# Patient Record
Sex: Male | Born: 1965 | Race: White | Hispanic: No | Marital: Married | Smoking: Current every day smoker
Health system: Southern US, Community
[De-identification: ages and names within clinical notes are randomized; demographics above are authoritative.]

## PROBLEM LIST (undated history)

## (undated) DIAGNOSIS — C61 Malignant neoplasm of prostate: Secondary | ICD-10-CM

---

## 2009-06-01 ENCOUNTER — Ambulatory Visit: Payer: Self-pay | Admitting: Urology

## 2017-08-10 ENCOUNTER — Other Ambulatory Visit: Payer: Self-pay

## 2017-08-10 ENCOUNTER — Emergency Department
Admission: EM | Admit: 2017-08-10 | Discharge: 2017-08-10 | Disposition: A | Discharge status: Home / Self Care | Payer: BLUE CROSS/BLUE SHIELD | Attending: Emergency Medicine | Admitting: Emergency Medicine

## 2017-08-10 ENCOUNTER — Emergency Department: Payer: BLUE CROSS/BLUE SHIELD

## 2017-08-10 ENCOUNTER — Encounter: Payer: Self-pay | Admitting: Emergency Medicine

## 2017-08-10 DIAGNOSIS — R030 Elevated blood-pressure reading, without diagnosis of hypertension: Secondary | ICD-10-CM | POA: Diagnosis not present

## 2017-08-10 DIAGNOSIS — Z8546 Personal history of malignant neoplasm of prostate: Secondary | ICD-10-CM | POA: Diagnosis not present

## 2017-08-10 DIAGNOSIS — R221 Localized swelling, mass and lump, neck: Secondary | ICD-10-CM | POA: Diagnosis present

## 2017-08-10 DIAGNOSIS — F17210 Nicotine dependence, cigarettes, uncomplicated: Secondary | ICD-10-CM | POA: Insufficient documentation

## 2017-08-10 DIAGNOSIS — T148XXA Other injury of unspecified body region, initial encounter: Secondary | ICD-10-CM

## 2017-08-10 DIAGNOSIS — R609 Edema, unspecified: Secondary | ICD-10-CM

## 2017-08-10 HISTORY — DX: Malignant neoplasm of prostate: C61

## 2017-08-10 MED ORDER — IBUPROFEN 600 MG PO TABS: 600.0000 mg | ORAL_TABLET | Freq: Three times a day (TID) | ORAL | 0 refills | Status: AC | PRN

## 2017-08-10 MED ORDER — CLONIDINE HCL 0.1 MG PO TABS
0.2000 mg | ORAL_TABLET | Freq: Once | ORAL | Status: AC
  Administered 2017-08-10: 0.2 mg via ORAL
  Filled 2017-08-10: qty 2

## 2017-08-10 MED ORDER — ORPHENADRINE CITRATE ER 100 MG PO TB12: 100.0000 mg | ORAL_TABLET | Freq: Two times a day (BID) | ORAL | 0 refills | Status: AC

## 2017-08-10 NOTE — ED Triage Notes (Signed)
Soft raised area proximal collarbone area since Friday. Denies injury. Is soft. States feels like a muscle spasm.Now is slightly raised area. States was larger on Saturday. States he want to [place] but they sent him here due to BP. States he does not usually have hypertension but thinks it is up due to discomfort.

## 2017-08-10 NOTE — ED Provider Notes (Signed)
[facility] Emergency Department Provider Note   ____________________________________________   First MD Initiated Contact with Patient 08/10/17 1359     (approximate)  I have reviewed the triage vital signs and the nursing notes.   HISTORY  Chief Complaint Mass    HPI Kenneth Yates is a 52 y.o. male patient complaining of pain and edema superior aspect of the proximal left clavicle for 2 days.  Patient denies injury.  Patient state the swelling has decreased today.  Patient rates pain as a 6/10.  Patient describes the pain as "sore".  Patient was seen at urgent care [facility] clinic today.  Patient stated clinic noticed his elevated blood pressure and sent to the emergency room without evaluating his chief complaint of swelling to the clavicle.  Patient stated no history of hypertension.  No palates measured for complaint.  Past Medical History:  Diagnosis Date  . Prostate cancer ([place])     There are no active problems to display for this patient.   History reviewed. No pertinent surgical history.  Prior to Admission medications   Medication Sig Start Date End Date Taking? Authorizing Provider  ibuprofen (ADVIL,MOTRIN) 600 MG tablet Take 1 tablet (600 mg total) by mouth every 8 (eight) hours as needed. 08/10/17   Sable Feil, PA-C  orphenadrine (NORFLEX) 100 MG tablet Take 1 tablet (100 mg total) by mouth 2 (two) times daily. 08/10/17   Sable Feil, PA-C    Allergies Patient has no known allergies.  No family history on file.  Social History Social History   Tobacco Use  . Smoking status: Current Every Day Smoker    Packs/day: 1.50    Types: Cigarettes  Substance Use Topics  . Alcohol use: Not on file  . Drug use: Not on file    Review of Systems Constitutional: No fever/chills Eyes: No visual changes. ENT: No sore throat. Cardiovascular: Denies chest pain. Respiratory: Denies shortness of breath. Gastrointestinal: No  abdominal pain.  No nausea, no vomiting.  No diarrhea.  No constipation. Genitourinary: Negative for dysuria. Musculoskeletal: Left lateral neck pain. Skin: Negative for rash. Neurological: Negative for headaches, focal weakness or numbness.   ____________________________________________   PHYSICAL EXAM:  VITAL SIGNS: ED Triage Vitals [08/10/17 1320]  Enc Vitals Group     BP (!) 184/114     Pulse Rate 94     Resp 20     Temp 98.2 F (36.8 C)     Temp Source Oral     SpO2 97 %     Weight 170 lb (77.1 kg)     Height 5\' 4"  (1.626 m)     Head Circumference      Peak Flow      Pain Score 6     Pain Loc      Pain Edu?      Excl. in [place]?    Constitutional: Alert and oriented. Well appearing and in no acute distress. Eyes: Conjunctivae are normal. PERRL. EOMI. Neck: No stridor.  No cervical spine tenderness to palpation. Hematological/Lymphatic/Immunilogical: No cervical lymphadenopathy. Cardiovascular: Normal rate, regular rhythm. Grossly normal heart sounds.  Good peripheral circulation.  Elevated blood pressure Respiratory: Normal respiratory effort.  No retractions. Lungs CTAB. Musculoskeletal: No obvious deformity to the clavicle.  Patient decreased range of motion with shoulder shrug limited by complaint of pain. Neurologic:  Normal speech and language. No gross focal neurologic deficits are appreciated. No gait instability. Skin:  Skin is warm, dry and intact.  No rash noted.  Palpable nodule lesion superior aspect of the left clavicle. Psychiatric: Mood and affect are normal. Speech and behavior are normal.  ____________________________________________   LABS (all labs ordered are listed, but only abnormal results are displayed)  Labs Reviewed - No data to display ____________________________________________  EKG   ____________________________________________  RADIOLOGY  ED MD interpretation:    Official radiology report(s): US Soft Tissue Head & Neck  (non-thyroid)  Result Date: 08/10/2017 CLINICAL DATA:  Swelling near left clavicle x3 days EXAM: ULTRASOUND OF HEAD/NECK SOFT TISSUES TECHNIQUE: Ultrasound examination of the head and neck soft tissues was performed in the area of clinical concern. COMPARISON:  None. FINDINGS: No mass, cyst, abscess, adenopathy, aneurysm, or other pathologic findings. Morphologically unremarkable normal sized lymph nodes measuring up to 0.4 cm short axis diameter incidentally noted. IMPRESSION: No pathologic findings on ultrasound. Electronically Signed   By: Lucrezia Europe M.D.   On: 08/10/2017 15:37    ____________________________________________   PROCEDURES  Procedure(s) performed: None  Procedures  Critical Care performed: No  ____________________________________________   INITIAL IMPRESSION / ASSESSMENT AND PLAN / ED COURSE  As part of my medical decision making, I reviewed the following data within the [place]    Elevated blood pressure.  Status post Catapres 0.2 mg patient blood pressure returned to 130/83.  Patient advised 3-day blood pressure check at the clinic.  Discussed ultrasound findings revealing.  No masses or lesions.      ____________________________________________   FINAL CLINICAL IMPRESSION(S) / ED DIAGNOSES  Final diagnoses:  Edema  Elevated blood pressure, situational  Muscle strain     ED Discharge Orders        Ordered    orphenadrine (NORFLEX) 100 MG tablet  2 times daily     08/10/17 1554    ibuprofen (ADVIL,MOTRIN) 600 MG tablet  Every 8 hours PRN     08/10/17 1554       Note:  This document was prepared using Dragon voice recognition software and may include unintentional dictation errors.    Sable Feil, PA-C 08/10/17 1559    Lavonia Drafts, MD 08/20/17 [phone]

## 2017-08-10 NOTE — Discharge Instructions (Signed)
Advised warm compresses to muscle strain left clavicle area.  Take medication as directed.  Advised 3-day BP check secondary to elevated readings today.

## 2017-08-10 NOTE — ED Notes (Signed)
Patient is complaining of left sided shoulder pain since Friday.  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  Patient states, "I've just never had my shoulder swell up and feel hot like it did over this weekend."  Patient is in no obvious distress at this time.

## 2018-02-22 ENCOUNTER — Emergency Department: Payer: BLUE CROSS/BLUE SHIELD

## 2018-02-22 ENCOUNTER — Emergency Department
Admission: EM | Admit: 2018-02-22 | Discharge: 2018-02-23 | Disposition: A | Discharge status: Home / Self Care | Payer: BLUE CROSS/BLUE SHIELD | Attending: Emergency Medicine | Admitting: Emergency Medicine

## 2018-02-22 ENCOUNTER — Other Ambulatory Visit: Payer: Self-pay

## 2018-02-22 DIAGNOSIS — Z79899 Other long term (current) drug therapy: Secondary | ICD-10-CM | POA: Diagnosis not present

## 2018-02-22 DIAGNOSIS — Z23 Encounter for immunization: Secondary | ICD-10-CM | POA: Insufficient documentation

## 2018-02-22 DIAGNOSIS — S82891A Other fracture of right lower leg, initial encounter for closed fracture: Secondary | ICD-10-CM | POA: Diagnosis not present

## 2018-02-22 DIAGNOSIS — Y939 Activity, unspecified: Secondary | ICD-10-CM | POA: Insufficient documentation

## 2018-02-22 DIAGNOSIS — S29001A Unspecified injury of muscle and tendon of front wall of thorax, initial encounter: Secondary | ICD-10-CM | POA: Diagnosis not present

## 2018-02-22 DIAGNOSIS — T148XXA Other injury of unspecified body region, initial encounter: Secondary | ICD-10-CM

## 2018-02-22 DIAGNOSIS — Y929 Unspecified place or not applicable: Secondary | ICD-10-CM | POA: Diagnosis not present

## 2018-02-22 DIAGNOSIS — S80811A Abrasion, right lower leg, initial encounter: Secondary | ICD-10-CM | POA: Insufficient documentation

## 2018-02-22 DIAGNOSIS — S01512A Laceration without foreign body of oral cavity, initial encounter: Secondary | ICD-10-CM

## 2018-02-22 DIAGNOSIS — Z8546 Personal history of malignant neoplasm of prostate: Secondary | ICD-10-CM | POA: Insufficient documentation

## 2018-02-22 DIAGNOSIS — F17210 Nicotine dependence, cigarettes, uncomplicated: Secondary | ICD-10-CM | POA: Insufficient documentation

## 2018-02-22 DIAGNOSIS — S0990XA Unspecified injury of head, initial encounter: Secondary | ICD-10-CM | POA: Diagnosis not present

## 2018-02-22 DIAGNOSIS — S8991XA Unspecified injury of right lower leg, initial encounter: Secondary | ICD-10-CM | POA: Diagnosis present

## 2018-02-22 DIAGNOSIS — F10920 Alcohol use, unspecified with intoxication, uncomplicated: Secondary | ICD-10-CM | POA: Diagnosis not present

## 2018-02-22 DIAGNOSIS — Y999 Unspecified external cause status: Secondary | ICD-10-CM | POA: Diagnosis not present

## 2018-02-22 LAB — URINALYSIS, COMPLETE (UACMP) WITH MICROSCOPIC
BILIRUBIN URINE: NEGATIVE
Bacteria, UA: NONE SEEN
Hgb urine dipstick: NEGATIVE
Ketones, ur: 5 mg/dL — AB
LEUKOCYTES UA: NEGATIVE
NITRITE: NEGATIVE
PH: 5 (ref 5.0–8.0)
Protein, ur: NEGATIVE mg/dL
SPECIFIC GRAVITY, URINE: 1.015 (ref 1.005–1.030)

## 2018-02-22 LAB — URINE DRUG SCREEN, QUALITATIVE (ARMC ONLY)
Amphetamines, Ur Screen: NOT DETECTED
BARBITURATES, UR SCREEN: NOT DETECTED
Benzodiazepine, Ur Scrn: NOT DETECTED
CANNABINOID 50 NG, UR ~~LOC~~: NOT DETECTED
COCAINE METABOLITE, UR ~~LOC~~: NOT DETECTED
MDMA (Ecstasy)Ur Screen: NOT DETECTED
METHADONE SCREEN, URINE: NOT DETECTED
Opiate, Ur Screen: NOT DETECTED
Phencyclidine (PCP) Ur S: NOT DETECTED
Tricyclic, Ur Screen: NOT DETECTED

## 2018-02-22 LAB — BASIC METABOLIC PANEL
Anion gap: 14 (ref 5–15)
BUN: 18 mg/dL (ref 6–20)
CHLORIDE: 102 mmol/L (ref 98–111)
CO2: 24 mmol/L (ref 22–32)
CREATININE: 0.73 mg/dL (ref 0.61–1.24)
Calcium: 10.1 mg/dL (ref 8.9–10.3)
GFR calc Af Amer: >60 mL/min (ref >60)
Glucose, Bld: 166 mg/dL — ABNORMAL HIGH (ref 70–99)
Potassium: 3.2 mmol/L — ABNORMAL LOW (ref 3.5–5.1)
SODIUM: 140 mmol/L (ref 135–145)

## 2018-02-22 LAB — CBC
HCT: 46.4 % (ref 40.0–52.0)
Hemoglobin: 16.5 g/dL (ref 13.0–18.0)
MCH: 33.8 pg (ref 26.0–34.0)
MCHC: 35.4 g/dL (ref 32.0–36.0)
MCV: 95.4 fL (ref 80.0–100.0)
PLATELETS: 172 10*3/uL (ref 150–440)
RBC: 4.87 MIL/uL (ref 4.40–5.90)
RDW: 13.4 % (ref 11.5–14.5)
WBC: 10.3 10*3/uL (ref 3.8–10.6)

## 2018-02-22 LAB — ETHANOL: Alcohol, Ethyl (B): 154 mg/dL — ABNORMAL HIGH (ref <10)

## 2018-02-22 LAB — LIPASE, BLOOD: Lipase: 55 U/L — ABNORMAL HIGH (ref 11–51)

## 2018-02-22 MED ORDER — SODIUM CHLORIDE 0.9 % IV BOLUS
1000.0000 mL | Freq: Once | INTRAVENOUS | Status: AC
  Administered 2018-02-22: 1000 mL via INTRAVENOUS

## 2018-02-22 MED ORDER — IOPAMIDOL (ISOVUE-300) INJECTION 61%
100.0000 mL | Freq: Once | INTRAVENOUS | Status: AC | PRN
  Administered 2018-02-22: 100 mL via INTRAVENOUS

## 2018-02-22 MED ORDER — TETANUS-DIPHTH-ACELL PERTUSSIS 5-2.5-18.5 LF-MCG/0.5 IM SUSP
0.5000 mL | Freq: Once | INTRAMUSCULAR | Status: AC
  Administered 2018-02-22: 0.5 mL via INTRAMUSCULAR
  Filled 2018-02-22: qty 0.5

## 2018-02-22 MED ORDER — LIDOCAINE HCL (PF) 1 % IJ SOLN
INTRAMUSCULAR | Status: AC
  Filled 2018-02-22: qty 5
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
## 2018-02-22 NOTE — ED Notes (Signed)
Per MD Quentin Cornwall pt needs bed now. RN Charge Marya Amsler called and pt taken to room 19. RN Ophelia Charter called and informed of pt coming to room. EDT Beth transporting pt at this time.
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
## 2018-02-23 MED ORDER — AMOXICILLIN-POT CLAVULANATE 875-125 MG PO TABS
1.0000 | ORAL_TABLET | Freq: Once | ORAL | Status: AC
  Administered 2018-02-23: 1 via ORAL
  Filled 2018-02-23: qty 1

## 2018-02-23 MED ORDER — OXYCODONE-ACETAMINOPHEN 2.5-325 MG PO TABS: 1.0000 | ORAL_TABLET | ORAL | 0 refills | Status: AC | PRN

## 2018-02-23 MED ORDER — AMOXICILLIN-POT CLAVULANATE 875-125 MG PO TABS: 1.0000 | ORAL_TABLET | Freq: Two times a day (BID) | ORAL | 0 refills | Status: AC

## 2018-02-23 MED ORDER — CHLORHEXIDINE GLUCONATE 0.12 % MT SOLN: 10.0000 mL | Freq: Four times a day (QID) | OROMUCOSAL | 0 refills | Status: AC
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
## 2018-02-25 ENCOUNTER — Other Ambulatory Visit: Payer: Self-pay | Admitting: Urology

## 2018-02-25 DIAGNOSIS — C61 Malignant neoplasm of prostate: Secondary | ICD-10-CM

## 2018-03-10 ENCOUNTER — Ambulatory Visit
Admission: RE | Admit: 2018-03-10 | Discharge: 2018-03-10 | Disposition: A | Discharge status: Home / Self Care | Payer: BLUE CROSS/BLUE SHIELD | Source: Ambulatory Visit | Attending: Urology | Admitting: Urology

## 2018-03-10 ENCOUNTER — Encounter
Admission: RE | Admit: 2018-03-10 | Discharge: 2018-03-10 | Disposition: A | Discharge status: Home / Self Care | Payer: BLUE CROSS/BLUE SHIELD | Source: Ambulatory Visit | Attending: Urology | Admitting: Urology

## 2018-03-10 DIAGNOSIS — C61 Malignant neoplasm of prostate: Secondary | ICD-10-CM | POA: Insufficient documentation

## 2018-03-10 MED ORDER — TECHNETIUM TC 99M MEDRONATE IV KIT
23.0190 | PACK | Freq: Once | INTRAVENOUS | Status: AC | PRN
  Administered 2018-03-10: 23.019 via INTRAVENOUS

## 2019-09-18 IMAGING — US US SOFT TISSUE HEAD/NECK
1 series · 7 of 7 positions shown · non-contrast
Comparison: None.

CLINICAL DATA: Swelling near left clavicle x3 days

EXAM:
ULTRASOUND OF HEAD/NECK SOFT TISSUES
TECHNIQUE: Ultrasound examination of the head and neck soft tissues was
performed in the area of clinical concern.

[Series 1: us soft tissue head/neck · 0.07mm/px · 7 of 7 slices shown]
[im 1/7]
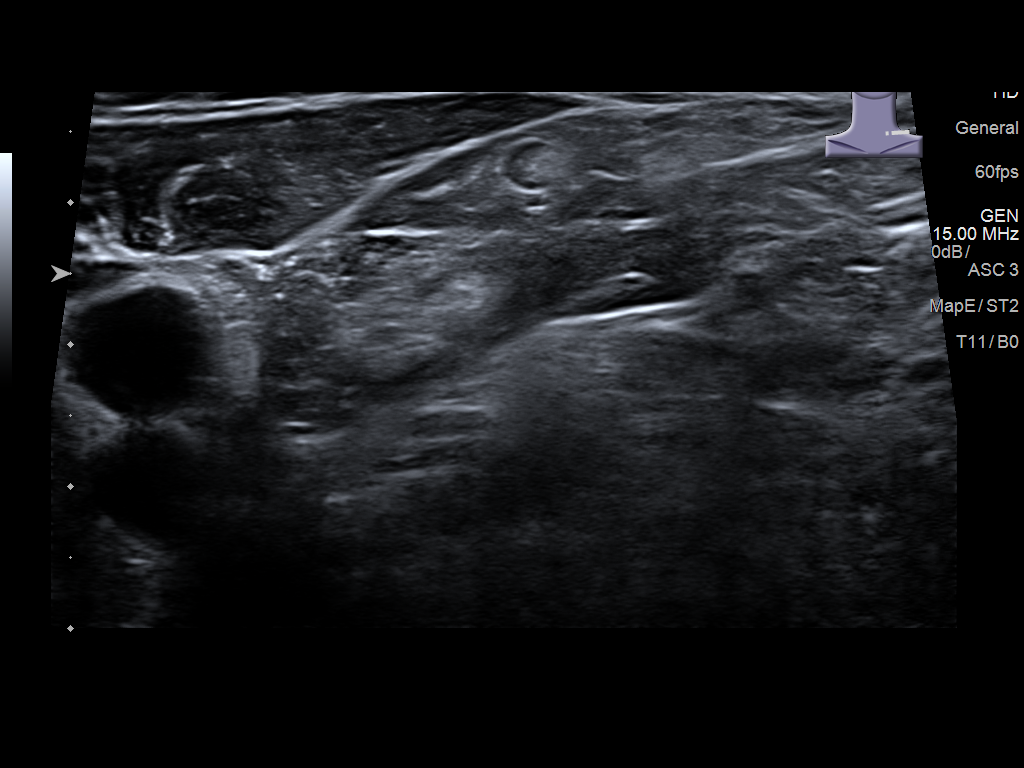
[im 2/7]
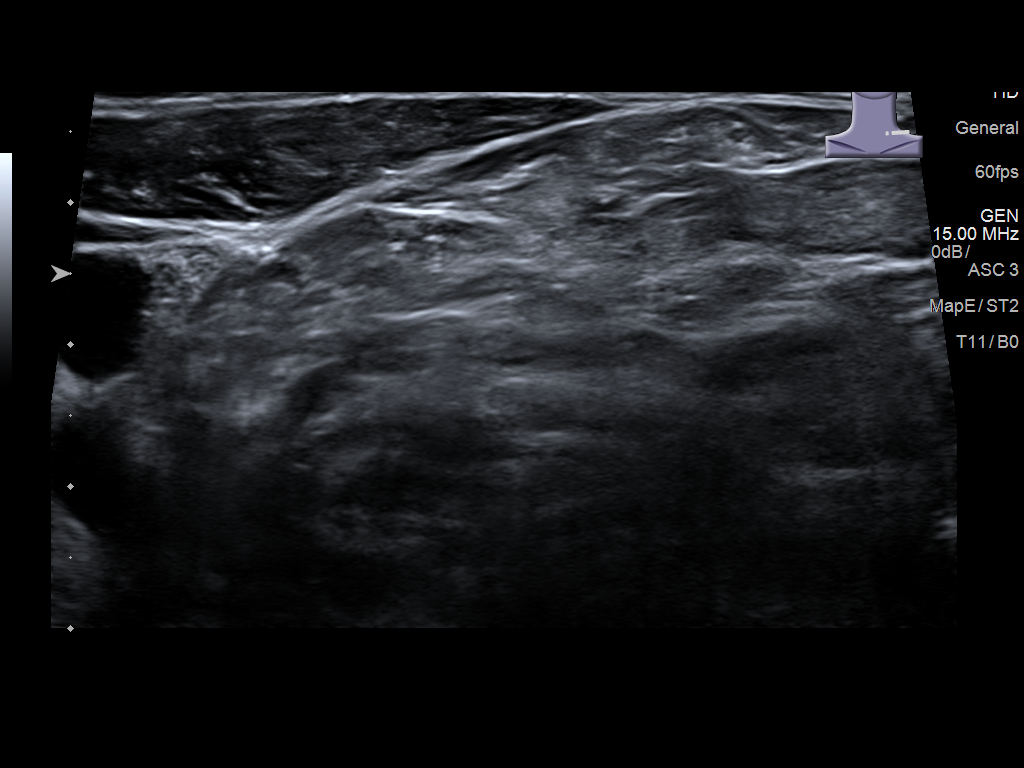
[im 3/7]
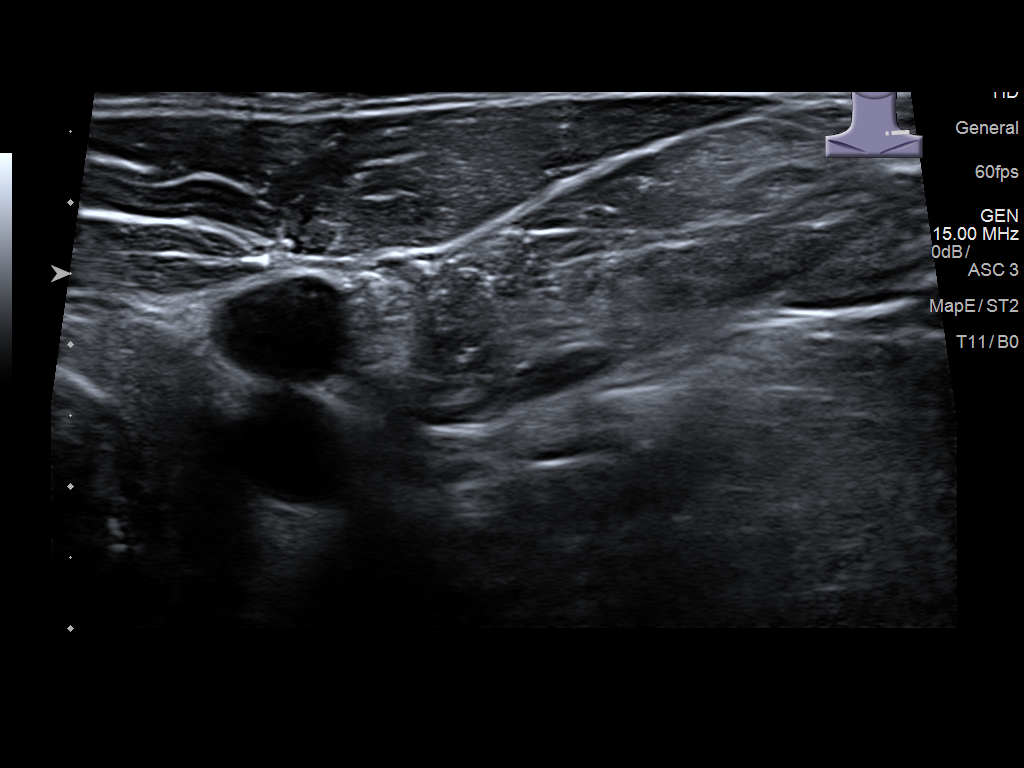
[im 4/7]
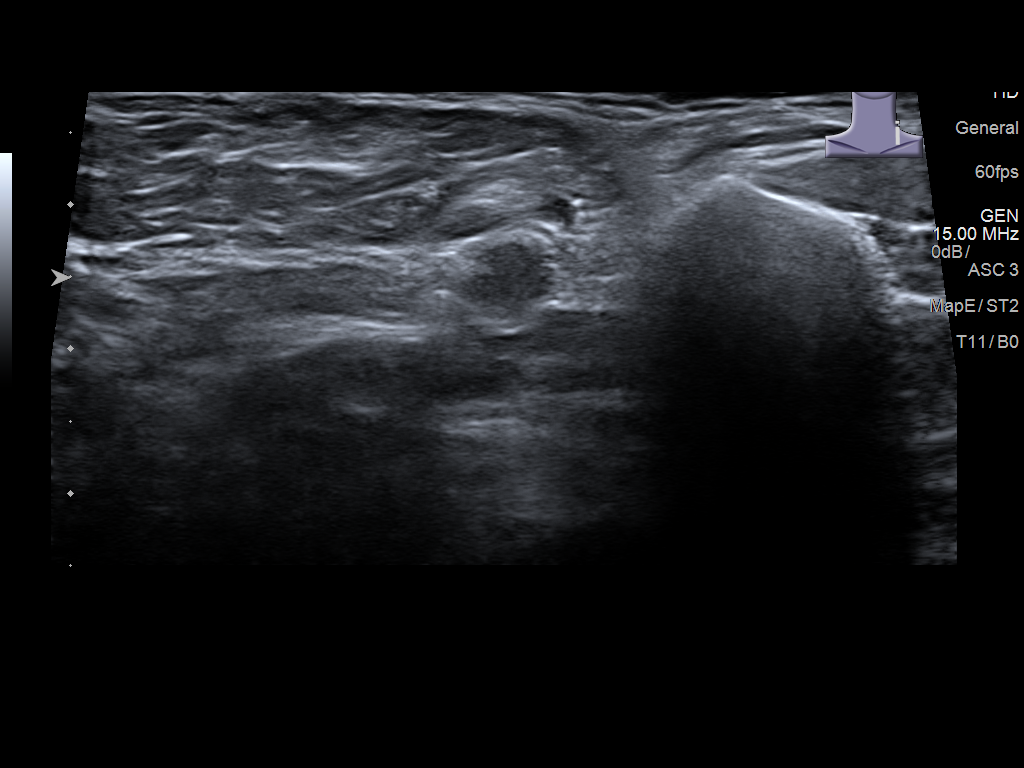
[im 5/7]
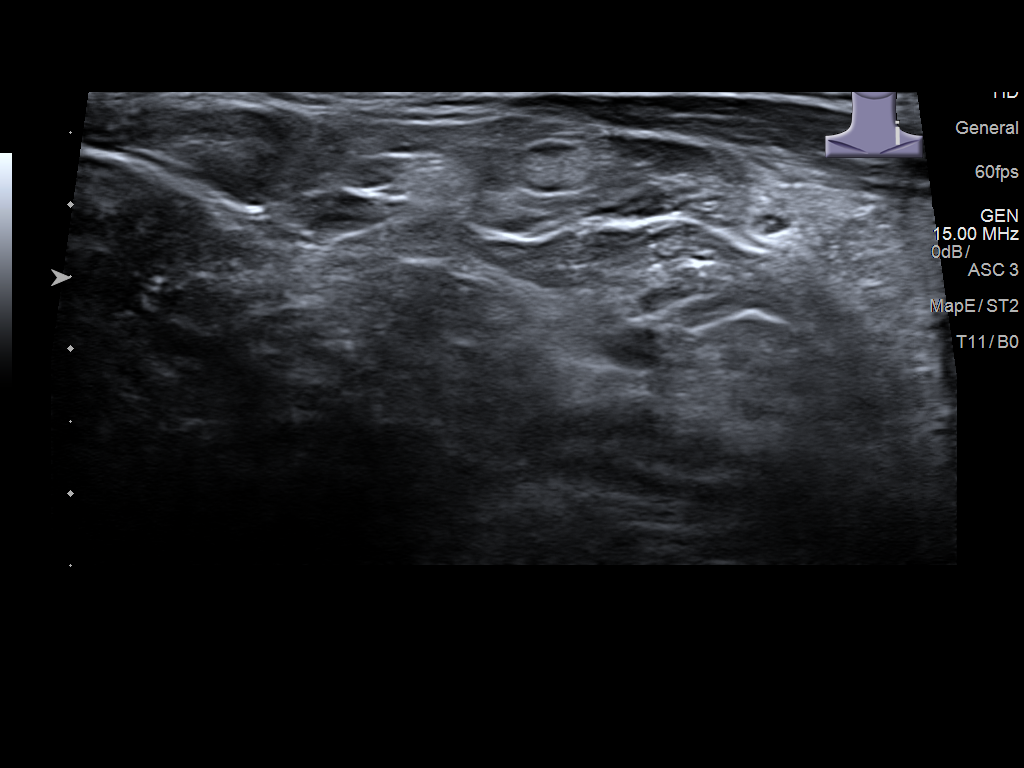
[im 6/7]
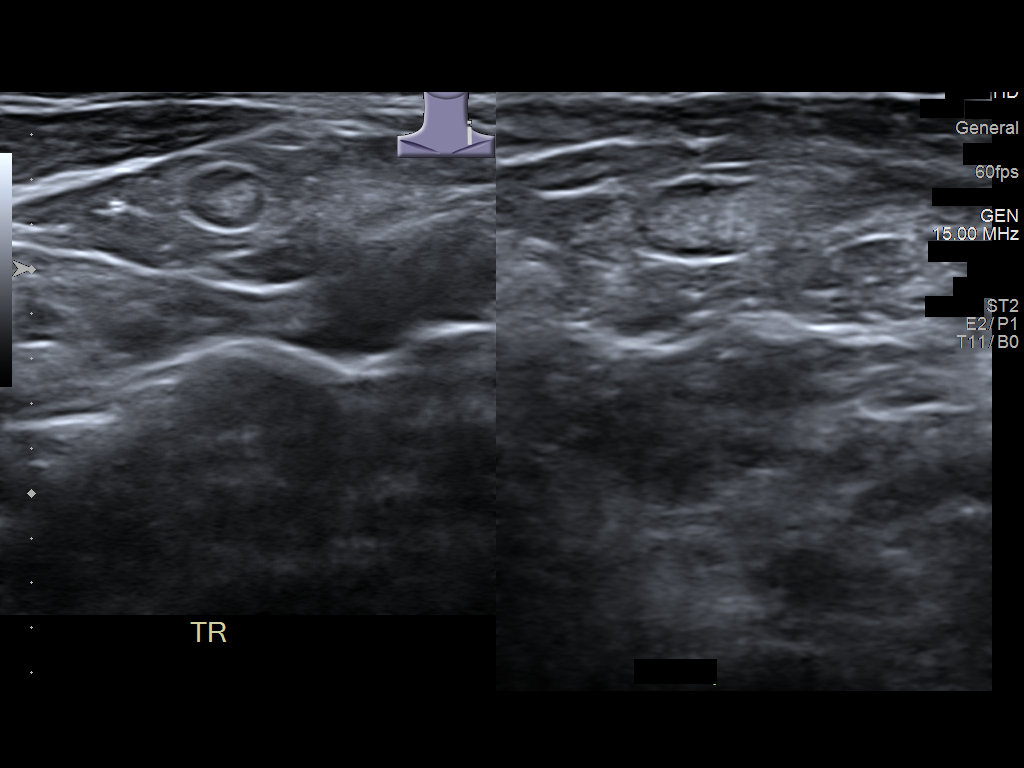
[im 7/7]
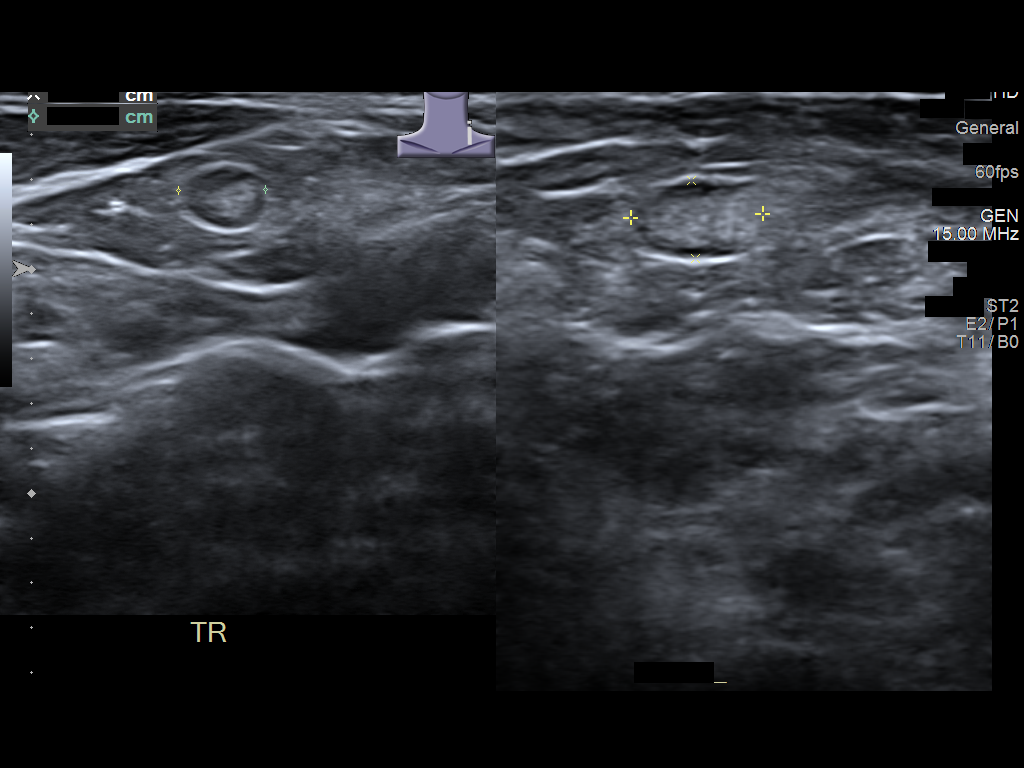

[7 of 7 positions shown; findings below may reference images not displayed]

FINDINGS: No mass, cyst, abscess, adenopathy, aneurysm, or other pathologic
findings. Morphologically unremarkable normal sized lymph nodes
measuring up to 0.4 cm short axis diameter incidentally noted.
IMPRESSION: No pathologic findings on ultrasound.

## 2023-05-07 ENCOUNTER — Ambulatory Visit: Admission: EM | Admit: 2023-05-07 | Discharge: 2023-05-07 | Payer: 59

## 2023-05-07 ENCOUNTER — Encounter: Payer: Self-pay | Admitting: Emergency Medicine

## 2023-05-07 DIAGNOSIS — R81 Glycosuria: Secondary | ICD-10-CM | POA: Diagnosis not present

## 2023-05-07 DIAGNOSIS — R748 Abnormal levels of other serum enzymes: Secondary | ICD-10-CM | POA: Diagnosis not present

## 2023-05-07 DIAGNOSIS — I10 Essential (primary) hypertension: Secondary | ICD-10-CM | POA: Diagnosis not present

## 2023-05-07 DIAGNOSIS — F17210 Nicotine dependence, cigarettes, uncomplicated: Secondary | ICD-10-CM | POA: Diagnosis not present

## 2023-05-07 DIAGNOSIS — R030 Elevated blood-pressure reading, without diagnosis of hypertension: Secondary | ICD-10-CM

## 2023-05-07 DIAGNOSIS — Z87891 Personal history of nicotine dependence: Secondary | ICD-10-CM | POA: Diagnosis not present

## 2023-05-07 NOTE — ED Triage Notes (Signed)
Pt states he went to the chiropractor yesterday and his BP was 250/205. Pt denies any symptoms or history of hypertension.

## 2023-05-07 NOTE — ED Provider Notes (Signed)
Patient presents for evaluation to elevation in blood pressure.  Endorses he was at the chiropractor 1 day ago and blood pressure was 250/205 when checked.  Denies all symptoms of hypertensive urgency.  Denies prior cardiac history.  Does endorse blood pressure was elevated at 1 time before when she was evaluated in the emergency department.  Daily tobacco use.  Blood pressure in triage 210/118, no prior history due to extent of elevation patient is being sent to the nearest emergency department so that blood pressure may be lowered safely and further cardiac evaluation to be escorted by family   Valinda Hoar, NP 05/07/23 1044

## 2023-05-07 NOTE — ED Notes (Signed)
Patient is being discharged from the Urgent Care and sent to the Emergency Department via personal vehicle  . Per Salli Quarry NP, patient is in need of higher level of care due to hypertension. Patient is aware and verbalizes understanding of plan of care.  Vitals:   05/07/23 1035  BP: (!) 210/118  Pulse: 94  Resp: 18  Temp: 98.2 F (36.8 C)  SpO2: 100%

## 2023-05-08 DIAGNOSIS — I10 Essential (primary) hypertension: Secondary | ICD-10-CM | POA: Diagnosis not present
